# Patient Record
Sex: Male | Born: 1990 | Race: Asian | Hispanic: No | Marital: Married | Smoking: Never smoker
Health system: Southern US, Community
[De-identification: ages and names within clinical notes are randomized; demographics above are authoritative.]

---
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
## 2017-04-14 DIAGNOSIS — M898X1 Other specified disorders of bone, shoulder: Secondary | ICD-10-CM | POA: Diagnosis not present

## 2017-04-14 DIAGNOSIS — M25512 Pain in left shoulder: Secondary | ICD-10-CM | POA: Diagnosis not present

## 2017-04-18 DIAGNOSIS — S42025A Nondisplaced fracture of shaft of left clavicle, initial encounter for closed fracture: Secondary | ICD-10-CM | POA: Diagnosis not present

## 2017-04-19 ENCOUNTER — Other Ambulatory Visit: Payer: Self-pay | Admitting: Sports Medicine

## 2017-04-19 DIAGNOSIS — R52 Pain, unspecified: Secondary | ICD-10-CM

## 2017-04-24 ENCOUNTER — Ambulatory Visit
Admission: RE | Admit: 2017-04-24 | Discharge: 2017-04-24 | Disposition: A | Discharge status: Home / Self Care | Payer: BLUE CROSS/BLUE SHIELD | Source: Ambulatory Visit | Attending: Sports Medicine | Admitting: Sports Medicine

## 2017-04-24 DIAGNOSIS — R918 Other nonspecific abnormal finding of lung field: Secondary | ICD-10-CM | POA: Diagnosis not present

## 2017-04-24 DIAGNOSIS — R52 Pain, unspecified: Secondary | ICD-10-CM

## 2017-04-25 DIAGNOSIS — S42025D Nondisplaced fracture of shaft of left clavicle, subsequent encounter for fracture with routine healing: Secondary | ICD-10-CM | POA: Diagnosis not present
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
## 2022-11-18 ENCOUNTER — Other Ambulatory Visit: Payer: Self-pay

## 2022-11-18 ENCOUNTER — Encounter (HOSPITAL_COMMUNITY): Payer: Self-pay

## 2022-11-18 ENCOUNTER — Emergency Department (HOSPITAL_COMMUNITY): Payer: Self-pay

## 2022-11-18 ENCOUNTER — Emergency Department (HOSPITAL_COMMUNITY)
Admission: EM | Admit: 2022-11-18 | Discharge: 2022-11-18 | Disposition: A | Discharge status: Home / Self Care | Payer: Self-pay | Attending: Emergency Medicine | Admitting: Emergency Medicine

## 2022-11-18 DIAGNOSIS — Y92410 Unspecified street and highway as the place of occurrence of the external cause: Secondary | ICD-10-CM | POA: Insufficient documentation

## 2022-11-18 DIAGNOSIS — Z23 Encounter for immunization: Secondary | ICD-10-CM | POA: Insufficient documentation

## 2022-11-18 DIAGNOSIS — S81812A Laceration without foreign body, left lower leg, initial encounter: Secondary | ICD-10-CM | POA: Diagnosis not present

## 2022-11-18 DIAGNOSIS — S8992XA Unspecified injury of left lower leg, initial encounter: Secondary | ICD-10-CM | POA: Diagnosis present

## 2022-11-18 MED ORDER — TETANUS-DIPHTH-ACELL PERTUSSIS 5-2.5-18.5 LF-MCG/0.5 IM SUSY
0.5000 mL | PREFILLED_SYRINGE | Freq: Once | INTRAMUSCULAR | Status: AC
  Administered 2022-11-18: 0.5 mL via INTRAMUSCULAR
  Filled 2022-11-18: qty 0.5

## 2022-11-18 MED ORDER — LIDOCAINE HCL (PF) 1 % IJ SOLN
5.0000 mL | Freq: Once | INTRAMUSCULAR | Status: AC
  Administered 2022-11-18: 5 mL
  Filled 2022-11-18: qty 30

## 2022-11-18 NOTE — Discharge Instructions (Signed)
It was a pleasure taking care of you today.  As discussed, you will need your sutures removed in 10 days.  Continue to keep area clean.  You may take over-the-counter ibuprofen or Tylenol as needed for pain.  Return to the ER for new or worsening symptoms.
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
## 2022-11-18 NOTE — ED Notes (Signed)
Patients wound cleaned and irrigated.

## 2022-11-18 NOTE — ED Notes (Signed)
Patients wound wrapped with gauze. Patient educated on getting sutures removed per discharge paperwork.

## 2022-12-02 ENCOUNTER — Ambulatory Visit
Admission: EM | Admit: 2022-12-02 | Discharge: 2022-12-02 | Disposition: A | Discharge status: Home / Self Care | Payer: Self-pay

## 2022-12-02 DIAGNOSIS — S81812A Laceration without foreign body, left lower leg, initial encounter: Secondary | ICD-10-CM
